# Patient Record
Sex: Female | Born: 1985 | Race: White | Hispanic: No | Marital: Single
Health system: Southern US, Community
[De-identification: ages and names within clinical notes are randomized; demographics above are authoritative.]

---

## 2012-01-02 ENCOUNTER — Ambulatory Visit: Payer: Self-pay | Admitting: General Surgery

## 2012-01-04 ENCOUNTER — Ambulatory Visit: Payer: Self-pay | Admitting: General Surgery

## 2012-07-04 ENCOUNTER — Emergency Department: Payer: Self-pay | Admitting: Emergency Medicine

## 2012-07-04 LAB — COMPREHENSIVE METABOLIC PANEL
Alkaline Phosphatase: 66 U/L (ref 50–136)
BUN: 11 mg/dL (ref 7–18)
Calcium, Total: 8.5 mg/dL (ref 8.5–10.1)
Co2: 22 mmol/L (ref 21–32)
Creatinine: 0.86 mg/dL (ref 0.60–1.30)
EGFR (African American): >60
Osmolality: 272 (ref 275–301)
Potassium: 4 mmol/L (ref 3.5–5.1)
SGOT(AST): 48 U/L — ABNORMAL HIGH (ref 15–37)
Sodium: 137 mmol/L (ref 136–145)
Total Protein: 7.7 g/dL (ref 6.4–8.2)

## 2012-07-04 LAB — CBC
HCT: 44.3 % (ref 35.0–47.0)
HGB: 15 g/dL (ref 12.0–16.0)
MCHC: 33.9 g/dL (ref 32.0–36.0)
RBC: 4.7 10*6/uL (ref 3.80–5.20)
RDW: 12.6 % (ref 11.5–14.5)

## 2012-07-04 LAB — URINALYSIS, COMPLETE
Leukocyte Esterase: NEGATIVE
Nitrite: NEGATIVE
Ph: 5 (ref 4.5–8.0)
Protein: NEGATIVE
RBC,UR: <1 /HPF (ref 0–5)
Squamous Epithelial: 3

## 2013-10-27 IMAGING — CR DG CHOLANGIOGRAM OPERATIVE
2 series · 2 of 2 positions shown · non-contrast
Comparison: none

REASON FOR EXAM: cholelithiasis
COMMENTS:

[[person_name]1]
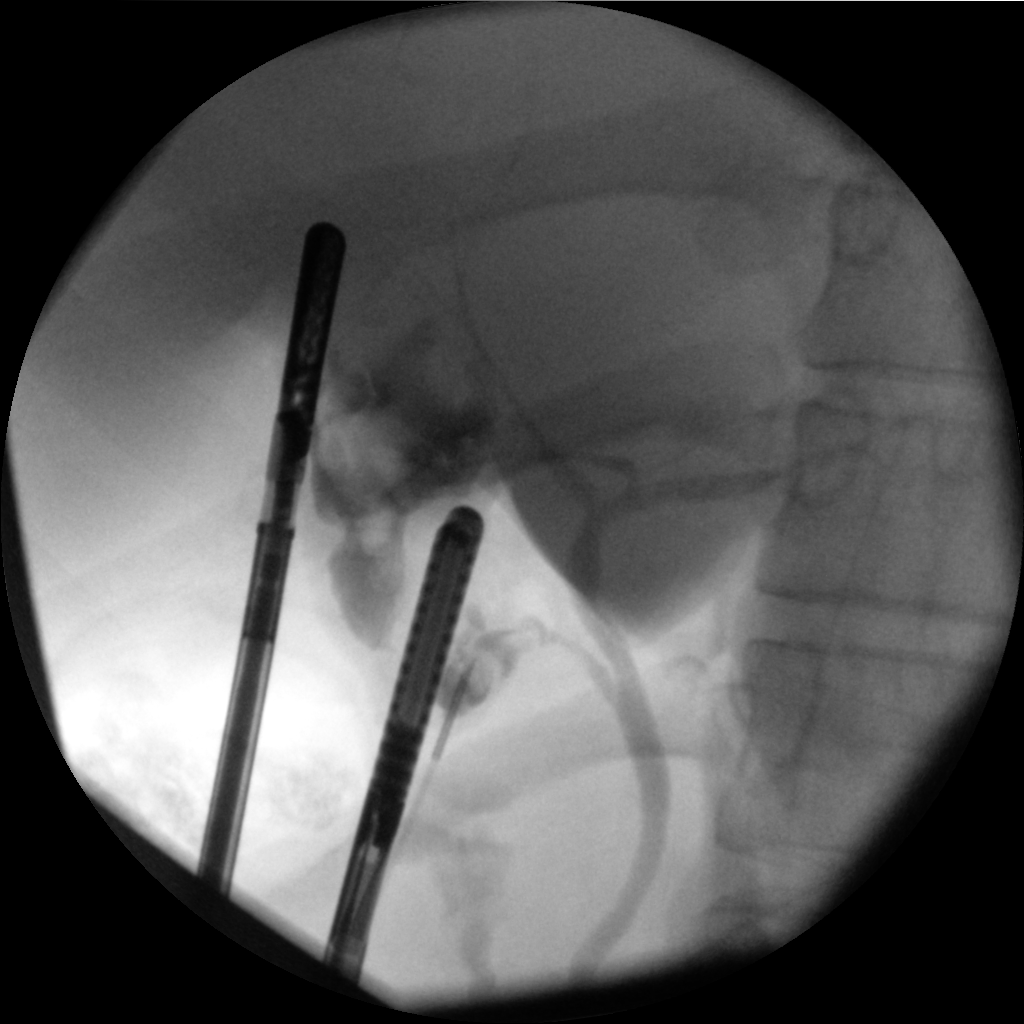

[[person_name]2]
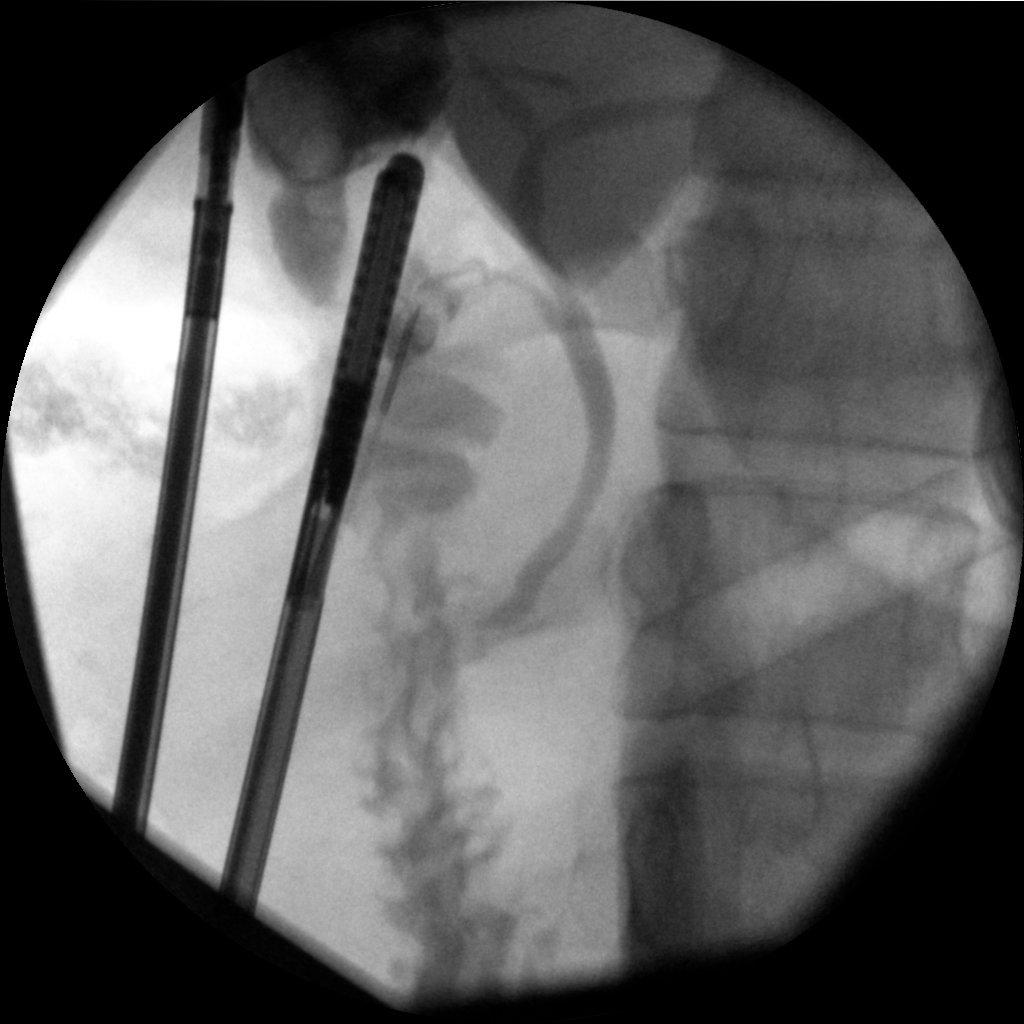

[2 of 2 positions shown; findings below may reference images not displayed]

PROCEDURE:     DXR - DXR CHOLANGIOGRAM OP (INITIAL)  - January 04, 2012  [DATE]

RESULT:     Two spot films from an operative cholangiogram reveal a normal
appearance of the visualized portions of the intrahepatic ducts and the
common bile duct. No filling defects are demonstrated to suggest a retained
stone.
IMPRESSION: There are no findings to suggest a retained stone. Further
interpretation is deferred to Dr. Zeinab.

## 2014-07-28 NOTE — Op Note (Signed)
PATIENT NAME:  Nicole Cole, Nicole Cole MR#:  [id number] DATE OF BIRTH:  1985/06/28  DATE OF PROCEDURE:  01/04/2012  PREOPERATIVE DIAGNOSIS: Chronic cholecystitis and cholelithiasis.   POSTOPERATIVE DIAGNOSIS: Chronic cholecystitis and cholelithiasis.   OPERATIVE PROCEDURE: Laparoscopic cholecystectomy with intraoperative cholangiograms.   SURGEON: Donnalee CurryJeffrey Upton Russey, MD   ANESTHESIA: General endotracheal under Dr. Henrene HawkingKephart.   ESTIMATED BLOOD LOSS: Less than 5 mL.   CLINICAL NOTE: This 29 year old woman has had symptomatic upper abdominal pain and ultrasound showed evidence of cholelithiasis. She was admitted for elective cholecystectomy.   OPERATIVE NOTE: With the patient under adequate general endotracheal anesthesia, the abdomen was prepped with ChloraPrep and draped. In Trendelenburg position, a Veress needle was positioned. After a normal hanging drop test was obtained the abdomen was insufflated with CO2 at 10 mmHg pressure. This was later increased to 12 mmHg pressure to provide better exposure. A 10-mm step port was expanded. No evidence of injury from initial port placement. An 11-mm X-Cel port was placed. The camera was moved to the upper abdominal port site and inspection of the original port site was unremarkable. Two 5-mm step ports were placed laterally. The patient was placed in reverse Trendelenburg position and rolled to the left. The gallbladder was placed on cephalad traction and the neck of the gallbladder cleared. Fluoroscopic cholangiograms were completed using 10 mL of one-half strength Conray 60. Normal cholangiograms were noted. The cystic duct and branches of the cystic artery were doubly clipped and divided. The gallbladder was delivered from the liver bed making use of hook cautery dissection. A small rent in the gallbladder was controlled and the gallbladder was placed in an Endo Catch bag. It was then delivered through the umbilical port site. Multiple 6 to 7-mm stones as well as  a large amount of sludge were retrieved. After re-establishing pneumoperitoneum, good hemostasis was appreciated in the upper quadrant of the abdomen. The area was irrigated with lactated Ringer's solution. The abdomen was then desufflated and ports removed under direct vision. Skin incisions were closed with 4-0 Vicryl subcuticular sutures. Benzoin, Steri-Strips, Telfa, and Tegaderm dressing was then applied.   The patient tolerated the procedure well and was taken to the recovery room in stable condition.    ____________________________ Earline MayotteJeffrey W. Gemma Ruan, MD [email or web address] D: 01/04/2012 21:32:22 ET T: 01/05/2012 10:05:34 ET JOB#: [id number]  cc: Earline MayotteJeffrey W. Mical Brun, MD, <Dictator> Meindert A. Lacie ScottsNiemeyer, MD Sailor Hevia Brion AlimentW Mychelle Kendra MD ELECTRONICALLY SIGNED 01/05/2012 10:45
